# Patient Record
Sex: Male | Born: 1970 | Race: White | Hispanic: No | Marital: Single | Smoking: Current some day smoker
Health system: Southern US, Community
[De-identification: ages and names within clinical notes are randomized; demographics above are authoritative.]

## PROBLEM LIST (undated history)

## (undated) DIAGNOSIS — G40909 Epilepsy, unspecified, not intractable, without status epilepticus: Secondary | ICD-10-CM

## (undated) DIAGNOSIS — M549 Dorsalgia, unspecified: Secondary | ICD-10-CM

## (undated) HISTORY — PX: VAGUS NERVE STIMULATOR INSERTION: SHX348

---

## 2004-09-02 ENCOUNTER — Emergency Department: Payer: Self-pay | Admitting: Emergency Medicine

## 2005-01-28 ENCOUNTER — Emergency Department: Payer: Self-pay | Admitting: Emergency Medicine

## 2005-01-28 ENCOUNTER — Other Ambulatory Visit: Payer: Self-pay

## 2005-04-01 ENCOUNTER — Emergency Department: Payer: Self-pay | Admitting: Emergency Medicine

## 2010-01-29 ENCOUNTER — Encounter: Payer: Self-pay | Admitting: Rehabilitation

## 2010-02-09 ENCOUNTER — Encounter: Payer: Self-pay | Admitting: Rehabilitation

## 2010-03-12 ENCOUNTER — Encounter: Payer: Self-pay | Admitting: Rehabilitation

## 2016-07-02 ENCOUNTER — Encounter: Payer: Self-pay | Admitting: Emergency Medicine

## 2016-07-02 ENCOUNTER — Emergency Department
Admission: EM | Admit: 2016-07-02 | Discharge: 2016-07-02 | Discharge status: Against Medical Advice | Payer: Medicare Other | Attending: Emergency Medicine | Admitting: Emergency Medicine

## 2016-07-02 DIAGNOSIS — J111 Influenza due to unidentified influenza virus with other respiratory manifestations: Secondary | ICD-10-CM | POA: Insufficient documentation

## 2016-07-02 DIAGNOSIS — F17200 Nicotine dependence, unspecified, uncomplicated: Secondary | ICD-10-CM | POA: Insufficient documentation

## 2016-07-02 DIAGNOSIS — R69 Illness, unspecified: Secondary | ICD-10-CM

## 2016-07-02 DIAGNOSIS — R05 Cough: Secondary | ICD-10-CM | POA: Diagnosis present

## 2016-07-02 HISTORY — DX: Dorsalgia, unspecified: M54.9

## 2016-07-02 HISTORY — DX: Epilepsy, unspecified, not intractable, without status epilepticus: G40.909

## 2016-07-02 MED ORDER — SODIUM CHLORIDE 0.9 % IV BOLUS (SEPSIS)
1000.0000 mL | Freq: Once | INTRAVENOUS | Status: AC
  Administered 2016-07-02: 1000 mL via INTRAVENOUS

## 2016-07-02 MED ORDER — ONDANSETRON 4 MG PO TBDP: 4.0000 mg | ORAL_TABLET | Freq: Once | ORAL | Status: DC

## 2016-07-02 MED ORDER — OSELTAMIVIR PHOSPHATE 75 MG PO CAPS: 75.0000 mg | ORAL_CAPSULE | Freq: Once | ORAL | Status: DC

## 2016-07-02 MED ORDER — OSELTAMIVIR PHOSPHATE 75 MG PO CAPS: 75.0000 mg | ORAL_CAPSULE | Freq: Two times a day (BID) | ORAL | 0 refills | Status: DC

## 2016-07-02 MED ORDER — ONDANSETRON 4 MG PO TBDP: 4.0000 mg | ORAL_TABLET | Freq: Four times a day (QID) | ORAL | 0 refills | Status: DC | PRN

## 2016-07-02 NOTE — ED Triage Notes (Addendum)
Pt with flu like symptoms and unable to keep anything down. Pt states he had a seizure earlier today. Take meds for same but unable to keep anything down.  Pt currently take VIMPAT for seizures.

## 2016-07-02 NOTE — ED Provider Notes (Signed)
[facility] Emergency Department Provider Note   ____________________________________________   First MD Initiated Contact with Patient 07/02/16 1520     (approximate)  I have reviewed the triage vital signs and the nursing notes.   HISTORY  Chief Complaint Seizures; Generalized Body Aches; and Back Pain    HPI Algis DownsDavid S Lawniczak is a 45 y.o. male here for evaluation of "the flu". Patient reports his daughter and wife were both diagnosed as having the flu, and his wife was started on Tamiflu yesterday. This morning early in the morning he awoke with chills, runny nose, slight dry cough, nausea, and had a brief "seizure". He reports he has a long history of multiple seizures, and this was not unusual for him, but he does report he feels body aches and chills and as though he has developed "the flu". No shortness of breath or chest pain. No abdominal pain but nausea, and did vomit this morning.  States his full body is aching everywhere. No headache or neck stiffness.  Past Medical History:  Diagnosis Date  . Back pain   . Seizure disorder (HCC)     There are no active problems to display for this patient.   No past surgical history on file.  Prior to Admission medications   Medication Sig Start Date End Date Taking? Authorizing Provider  ondansetron (ZOFRAN ODT) 4 MG disintegrating tablet Take 1 tablet (4 mg total) by mouth every 6 (six) hours as needed for nausea or vomiting. 07/02/16   Sharyn CreamerMark Sadao Weyer, MD  oseltamivir (TAMIFLU) 75 MG capsule Take 1 capsule (75 mg total) by mouth 2 (two) times daily. 07/02/16   Sharyn CreamerMark Franky Reier, MD    Allergies Depakote [valproic acid] and Nsaids  No family history on file.  Social History Social History  Substance Use Topics  . Smoking status: Current Some Day Smoker  . Smokeless tobacco: Never Used  . Alcohol use Yes    Review of Systems Constitutional: Fever and chills Eyes: No visual changes. ENT: Scratchy  throat Cardiovascular: Denies chest pain. Respiratory: Denies shortness of breath. Gastrointestinal: No abdominal pain.    No diarrhea.  No constipation. Genitourinary: Negative for dysuria. Musculoskeletal: All his muscles feel "achy" all over. Skin: Negative for rash. Neurological: Negative for headaches, focal weakness or numbness.  10-point ROS otherwise negative.  ____________________________________________   PHYSICAL EXAM:  VITAL SIGNS: ED Triage Vitals [07/02/16 1316]  Enc Vitals Group     BP (!) 134/104     Pulse Rate (!) 110     Resp 20     Temp 99.5 F (37.5 C)     Temp Source Oral     SpO2 97 %     Weight      Height      Head Circumference      Peak Flow      Pain Score      Pain Loc      Pain Edu?      Excl. in GC?     Constitutional: Alert and oriented. Well appearing and in no acute distress.Appears mildly ill however. Eyes: Conjunctivae are slightly injected, no jaundice. PERRL. EOMI. Head: Atraumatic. Nose: Mild clear coryza and "sniffles" Mouth/Throat: Mucous membranes are slightly dry.  Oropharynx slightly injected. Neck: No stridor.  No meningismus or rigidity. Cardiovascular: Minimally tachycardic rate, regular rhythm. Grossly normal heart sounds.  Good peripheral circulation. Respiratory: Normal respiratory effort.  No retractions. Lungs CTAB. Gastrointestinal: Soft and nontender. No distention.  Musculoskeletal: Moves  all extremities well. Walks with a normal gait No lower extremity tenderness nor edema.  No joint effusions. Neurologic:  Normal speech and language. No gross focal neurologic deficits are appreciated. Normal cranial nerve exam. No tremors No gait instability. Skin:  Skin is warm, dry and intact. No rash noted. Psychiatric: Mood and affect are normal. Speech and behavior are normal. He is alert, oriented to date time and place.  ____________________________________________   LABS (all labs ordered are listed, but only abnormal  results are displayed)  Labs Reviewed - No data to display ____________________________________________  EKG  Reviewed and interpreted by me at 1320 Ventricular rate 100 QRS 90 QTc 410 Normal axis Mild ST elevation noted in V2 and V3, likely consistent with early repolarization abnormality. Compared with his previous EKG from 2006, appears similar   Patient denies any cardiac or pulmonary symptoms at this time ____________________________________________  RADIOLOGY   ____________________________________________   PROCEDURES  Procedure(s) performed: None  Procedures  Critical Care performed: No  ____________________________________________   INITIAL IMPRESSION / ASSESSMENT AND PLAN / ED COURSE  Pertinent labs & imaging results that were available during my care of the patient were reviewed by me and considered in my medical decision making (see chart for details).  Patient presents with a viral-type symptomatology, with upper respiratory symptoms and reporting his wife had diagnosis of the flu yesterday. Based on close contact with 2 persons who metered reports her diagnosed with the flu and one on Tamiflu, I suspect he likely also has influenza or influenza-like illness. He is notably slightly tachycardic with low-grade fever. No cardiac or pulmonary symptoms. Does report having a seizure, and was not able tolerate his seizure medicine at home this morning due to nausea.  I highly recommended to the patient, his mother, and also his daughter that he stay for hydration, further workup, labs, and further evaluation. He reports that he needs to leave. He appears to have capacity, clearly understands my recommendations and the risks of his leaving. I will provide him with discharge instructions, as well as prescription for Tamiflu and Zofran for symptomatic relief and treatment of possible flu discuss with him this very provisional diagnosis and I am not able to perform  additional laboratory testing at this time as the patient wishes to leave without further workup.   07/02/2016 at 3:32 PM:  The patient requested to leave.  I considered this to be leaving against medical advice. I personally discussed the following with them:  1)  That they currently had a medical condition of likely the flu or influenza and I am concerned that they may have dehydration and need further evaluation for the severity and possible need for admission.  I cannot exclude condition such as pneumonia, or of severe bacterial infection, or severe flu at this time  2)  My proposed course of evaluation and treatment includes, but is not limited to,  IV hydration, checking a flu test and labs, chest x-Pense, rehydration, providing him his seizure medicine.  Benefits of staying include possible diagnosis or excluding of severe influenza or other severe bacterial infection or an alternative serious condition such as pneumonia, which if identified early would lead to appropriate intervention in a timely manner lessening the burden of disability and death.  3) Risks of leaving before this had been completed include: misdiagnosis, worsening illness leading up to and including prolonged or permanent disability or death.  Specific risks pertinent, but not all inclusive, of their current medical condition include but are  not limited to worsening or severe infection.  I also discussed alternatives including staying in the ER for hydration and additional workup, or being admitted.  Despite this they stated they wanted to leave due to needing to go home and not wanting to stay in the ER or hospital and refused further evaluation, treatment, or admission at this time.   They appeared clinically sober, were mentating appropriately, were free from distracting injury, had adequately controlled acute pain, appeared to have intact insight, judgment, and reason, and in my opinion had the capacity to make this  decision.  Specifically, they were able to verbally state back in a coherent manner their current medical condition/current diagnosis, the proposed course of evaluation and/or treatment, and the risks, benefits, and alternatives of treatment versus leaving against medical advice.   They understand that they may return to seek medical attention here at ANY time they want.  I strongly advised them to return to the Emergency Department immediately if they experience any new or worsening symptoms that concern them, or simply if they reconsider continued evaluation and/or treatment as previously discussed.  This would be without any repercussions, though they understand they likely will need to wait again in the Emergency Department if other patients are in front of them, rather than being brought straight back.  They understood this is another advantage of staying, but still insisted upon leaving.  I recommended they follow-up with his primary care at [facility] at the earliest available opportunity/appointment for further evaluation and treatment.   The patient was discharged against medical advice, I recommended he not drive and he agrees with this.  They did accept written discharge instructions.    Clinical Course      ____________________________________________   FINAL CLINICAL IMPRESSION(S) / ED DIAGNOSES  Final diagnoses:  Influenza-like illness  Influenza      NEW MEDICATIONS STARTED DURING THIS VISIT:  New Prescriptions   ONDANSETRON (ZOFRAN ODT) 4 MG DISINTEGRATING TABLET    Take 1 tablet (4 mg total) by mouth every 6 (six) hours as needed for nausea or vomiting.   OSELTAMIVIR (TAMIFLU) 75 MG CAPSULE    Take 1 capsule (75 mg total) by mouth 2 (two) times daily.     Note:  This document was prepared using Dragon voice recognition software and may include unintentional dictation errors.     Sharyn Creamer, MD 07/02/16 [phone]

## 2016-07-02 NOTE — ED Notes (Signed)
Pt daughter has come to desk multiple times about pt getting upset about waiting. Daughter now states he is angry wants to leave. Daughter informed that we can't keep him here, Dillon AddisonKatie Rn asked first nurse to come talk to pt and daughter.

## 2016-07-02 NOTE — Discharge Instructions (Signed)
You have been seen in the Emergency Department (ED) today for a likely viral illness.  Please drink plenty of clear fluids (water, Gatorade, chicken broth, etc).  You may use Tylenol according to label instructions.  You can alternate between the two without any side effects.   No driving today, and please resume taking your regular seizure medications as prescribed.  Please follow up with your doctor as listed above.  Call your doctor or return to the Emergency Department (ED) if you are unable to tolerate fluids due to vomiting, have worsening trouble breathing, become extremely tired or difficult to awaken, or if you develop any other symptoms that concern you.

## 2016-07-02 NOTE — ED Notes (Signed)
Pt states "i just want to go home, I have ibuprofen there".  He reports does not want to do the labs just drawn or wait for fluids to finish. Dr Fanny Bienquale notified.

## 2018-11-29 ENCOUNTER — Emergency Department
Admission: EM | Admit: 2018-11-29 | Discharge: 2018-11-29 | Disposition: A | Discharge status: Home / Self Care | Payer: Medicare Other | Attending: Emergency Medicine | Admitting: Emergency Medicine

## 2018-11-29 ENCOUNTER — Emergency Department: Payer: Medicare Other

## 2018-11-29 ENCOUNTER — Encounter: Payer: Self-pay | Admitting: Emergency Medicine

## 2018-11-29 DIAGNOSIS — R569 Unspecified convulsions: Secondary | ICD-10-CM | POA: Diagnosis present

## 2018-11-29 DIAGNOSIS — Z79899 Other long term (current) drug therapy: Secondary | ICD-10-CM | POA: Diagnosis not present

## 2018-11-29 DIAGNOSIS — F17200 Nicotine dependence, unspecified, uncomplicated: Secondary | ICD-10-CM | POA: Diagnosis not present

## 2018-11-29 DIAGNOSIS — Z91040 Latex allergy status: Secondary | ICD-10-CM | POA: Insufficient documentation

## 2018-11-29 LAB — CBC
HCT: 45.5 % (ref 39.0–52.0)
Hemoglobin: 15.3 g/dL (ref 13.0–17.0)
MCH: 30 pg (ref 26.0–34.0)
MCHC: 33.6 g/dL (ref 30.0–36.0)
MCV: 89.2 fL (ref 80.0–100.0)
Platelets: 215 10*3/uL (ref 150–400)
RBC: 5.1 MIL/uL (ref 4.22–5.81)
RDW: 14 % (ref 11.5–15.5)
WBC: 8 10*3/uL (ref 4.0–10.5)
nRBC: 0 % (ref 0.0–0.2)

## 2018-11-29 LAB — BASIC METABOLIC PANEL
Anion gap: 12 (ref 5–15)
BUN: 17 mg/dL (ref 6–20)
CO2: 19 mmol/L — ABNORMAL LOW (ref 22–32)
Calcium: 8.8 mg/dL — ABNORMAL LOW (ref 8.9–10.3)
Chloride: 103 mmol/L (ref 98–111)
Creatinine, Ser: 1.44 mg/dL — ABNORMAL HIGH (ref 0.61–1.24)
GFR calc Af Amer: >60 mL/min (ref >60)
GFR calc non Af Amer: 58 mL/min — ABNORMAL LOW (ref >60)
Glucose, Bld: 163 mg/dL — ABNORMAL HIGH (ref 70–99)
Potassium: 3.7 mmol/L (ref 3.5–5.1)
Sodium: 134 mmol/L — ABNORMAL LOW (ref 135–145)

## 2018-11-29 MED ORDER — SODIUM CHLORIDE 0.9 % IV BOLUS
500.0000 mL | Freq: Once | INTRAVENOUS | Status: AC
Start: 2018-11-29 — End: 2018-11-29
  Administered 2018-11-29: 500 mL via INTRAVENOUS

## 2018-11-29 MED ORDER — LORAZEPAM 2 MG/ML IJ SOLN
0.5000 mg | Freq: Once | INTRAMUSCULAR | Status: AC
  Administered 2018-11-29: 0.5 mg via INTRAVENOUS
  Filled 2018-11-29: qty 1

## 2018-11-29 MED ORDER — SODIUM CHLORIDE 0.9 % IV SOLN
200.0000 mg | INTRAVENOUS | Status: AC
  Administered 2018-11-29: 200 mg via INTRAVENOUS
  Filled 2018-11-29: qty 20

## 2018-11-29 NOTE — ED Triage Notes (Signed)
Pt to ED by EMS after having a seizure this morning. Pt has hx of seizures and MS. Pt hit head on bathroom mirror this morning and has multiple cuts on back possibly from glass.

## 2018-11-29 NOTE — ED Notes (Addendum)
Pt refusing to keep monitor leads on. Pt A&O x4 and NAD at this time. Pt asking when he can be "released" to go home.

## 2018-11-29 NOTE — ED Notes (Signed)
Pt discharged home after verbalizing understanding of discharge instructions; nad noted. 

## 2018-11-29 NOTE — Discharge Instructions (Addendum)
Please follow-up closely with your neurologist at [facility].  They should be calling you, but please call them Monday to speak with your physician and team about your seizure this weekend.  No driving cars, swimming in bodies of water, or going up to high heights or places where you could become injured if you had a seizure.  You have been seen in the emergency department today for a seizure.  Your workup today including labs are within normal limits.  Please follow up with your doctor/neurologist as soon as possible regarding today's emergency department visit and your likely seizure.  Please continue your current medications  As we have discussed it is very important that you do not drive until you have been seen and cleared by your neurologist.  Please drink plenty of fluids, get plenty of sleep and avoid any alcohol or drug use Please return to the emergency department if you have any further seizures which do not respond to medications, or for any other symptoms per se concerning for yourself.

## 2018-11-29 NOTE — ED Provider Notes (Signed)
[facility] Emergency Department Provider Note   ____________________________________________   First MD Initiated Contact with Patient 11/29/18 [phone]     (approximate)  I have reviewed the triage vital signs and the nursing notes.   HISTORY  Chief Complaint Seizures    HPI Dillon Ryan is a 48 y.o. male history of refractory seizures with vagal nerve stimulator  Patient presents today, EMS was called out for a witnessed seizure.  Patient had a seizure, description not quite fully known, but had fallen forward and broke the mirror in his bathroom.  He has a contusion to the front of the head.  He was postictal and somewhat agitated, but at the present time he is calm and pleasant.  He reports that he does not remember what happened, but he is pretty sure he had a seizure.  Reports the same thing is happened to him many times in the past.  He has a vagal nerve stimulator, EMS did report that he had not had his medications yet today.  Patient believes he has been compliant with his medication and his only seizure medicine is Vimpat  He has not had any nausea or vomiting.  No fevers or chills.  No chest pain or trouble breathing.   Past Medical History:  Diagnosis Date  . Back pain   . Seizure disorder (HCC)     There are no active problems to display for this patient.   History reviewed. No pertinent surgical history.  Prior to Admission medications   Medication Sig Start Date End Date Taking? Authorizing Provider  clonazePAM (KLONOPIN) 1 MG tablet Take 1 mg by mouth 2 (two) times daily. 06/23/18 10/12/19 Yes [provider]  lacosamide (VIMPAT) 200 MG TABS tablet Take 600 mg by mouth 2 (two) times daily. 06/23/18 06/23/19 Yes [provider]  oxyCODONE-acetaminophen (PERCOCET) 10-325 MG tablet Take 1 tablet by mouth 3 (three) times daily. 11/11/18  Yes [provider]  sildenafil (REVATIO) 20 MG tablet Take 20 mg by mouth as  directed. 08/13/18  Yes [provider]  simvastatin (ZOCOR) 40 MG tablet Take 40 mg by mouth Nightly. 11/09/18  Yes [provider]  topiramate (TOPAMAX) 50 MG tablet Take 50-100 mg by mouth 2 (two) times daily. 10/12/18  Yes [provider]  ondansetron (ZOFRAN ODT) 4 MG disintegrating tablet Take 1 tablet (4 mg total) by mouth every 6 (six) hours as needed for nausea or vomiting. Patient not taking: Reported on 11/29/2018 07/02/16   Sharyn Creamer, MD  oseltamivir (TAMIFLU) 75 MG capsule Take 1 capsule (75 mg total) by mouth 2 (two) times daily. Patient not taking: Reported on 11/29/2018 07/02/16   Sharyn Creamer, MD    Allergies Other; Phenytoin; Valproic acid; Latex; Perampanel; and Nsaids  History reviewed. No pertinent family history.  Social History Social History   Tobacco Use  . Smoking status: Current Some Day Smoker  . Smokeless tobacco: Never Used  Substance Use Topics  . Alcohol use: Yes  . Drug use: Not on file    Review of Systems  Somewhat unreliable and the patient has poor recollection of today's events, but denies acute illness and feels that he definitely had a "seizure" as he had the same in the past many times  Constitutional: No fever/chills Eyes: No visual changes. ENT: No sore throat. Cardiovascular: Denies chest pain. Respiratory: Denies shortness of breath. Gastrointestinal: No abdominal pain.   Genitourinary: Negative for dysuria. Musculoskeletal: Negative for back pain. Skin: Negative  for rash except he feels like he is got some cuts or something over his right upper back. Neurological: Negative for areas of focal weakness or numbness.  He does report he is having a moderate throbbing headache and some tenderness across the front of the skull.  He gets headaches after seizures.    ____________________________________________   PHYSICAL EXAM:  VITAL SIGNS: ED Triage Vitals  Enc Vitals Group     BP 11/29/18 0849 93/63      Pulse Rate 11/29/18 0849 72     Resp 11/29/18 0849 (!) 24     Temp 11/29/18 0849 98.2 F (36.8 C)     Temp Source 11/29/18 0849 Oral     SpO2 11/29/18 0849 98 %     Weight 11/29/18 0850 170 lb (77.1 kg)     Height 11/29/18 0850 6' (1.829 m)     Head Circumference --      Peak Flow --      Pain Score 11/29/18 0849 10     Pain Loc --      Pain Edu? --      Excl. in GC? --     Constitutional: Alert and oriented to self and place, was evidently quite postictal with EMS but he is now seeming to clear but does not recall seizure occurring. Well appearing and in no acute distress. Eyes: Conjunctivae are normal.  Normal extraocular movements. Head: Atraumatic for a very small contusion and abrasion over the left frontal region. Nose: No congestion/rhinnorhea. Mouth/Throat: Mucous membranes are moist. Neck: No stridor.  Cardiovascular: Normal rate, regular rhythm. Grossly normal heart sounds.  Good peripheral circulation. Respiratory: Normal respiratory effort.  No retractions. Lungs CTAB. Gastrointestinal: Soft and nontender. No distention. Musculoskeletal: No lower extremity tenderness nor edema. Neurologic:  Normal speech and language. No gross focal neurologic deficits are appreciated.  He is awake and alert.  No tremulousness.  Moves all extremities well.  Follows commands. Skin:  Skin is warm, dry and intact. No rash noted except for about 4-5 superficial abrasions over the right posterior chest wall.Marland Kitchen. Psychiatric: Mood and affect are normal. Speech and behavior are normal.  ____________________________________________   LABS (all labs ordered are listed, but only abnormal results are displayed)  Labs Reviewed  BASIC METABOLIC PANEL - Abnormal; Notable for the following components:      Result Value   Sodium 134 (*)    CO2 19 (*)    Glucose, Bld 163 (*)    Creatinine, Ser 1.44 (*)    Calcium 8.8 (*)    GFR calc non Af Amer 58 (*)    All other components within normal limits   CBC   ____________________________________________  EKG  Reviewed and interpreted by me at 9 AM Heart rate 75 QRS 110 QTc 400 Normal sinus rhythm, there is slight Q wave in V1 and V2, with slight ST elevation notable in V2.  Also some very minimal T wave abnormality seen in V5 and V6, compared with this previous EKG from 2017 this is unchanged.  Of note the patient has no cardiopulmonary complaint either. ____________________________________________  RADIOLOGY  Ct Head Wo Contrast  Result Date: 11/29/2018 CLINICAL DATA:  Seizure.  Multiple lacerations posteriorly. EXAM: CT HEAD WITHOUT CONTRAST TECHNIQUE: Contiguous axial images were obtained from the base of the skull through the vertex without intravenous contrast. COMPARISON:  January 28, 2005 FINDINGS: Brain: No evidence of acute infarction, hemorrhage, hydrocephalus, extra-axial collection or mass lesion/mass effect. Vascular: No hyperdense vessel or unexpected  calcification. Skull: Normal. Negative for fracture or focal lesion. Sinuses/Orbits: No acute finding. Other: None. IMPRESSION: No acute intracranial abnormalities identified. Electronically Signed   By: Gerome Sam III M.D   On: 11/29/2018 09:34    CT head reviewed negative for acute ____________________________________________   PROCEDURES  Procedure(s) performed: None  Procedures  Critical Care performed: No  ____________________________________________   INITIAL IMPRESSION / ASSESSMENT AND PLAN / ED COURSE  Pertinent labs & imaging results that were available during my care of the patient were reviewed by me and considered in my medical decision making (see chart for details).   Patient presents for evaluation for what appears to have been a breakthrough seizure.  Patient has a well known and well-documented history of seizure disorder, he is currently treated on Vimpat.  Today he had a seizure while in his bathroom.  He does have an abrasion over the forehead  and small superficial lacerations without evidence of acute foreign body over the right thoracic region without any puncture wounds.  Ordered a head CT, basic labs, based on his [facility] notes I have ordered a 200 mg Vimpat dose but will clarify as there is some potential he may need a higher dose today.  His mental status is clearly improving, and he is not complaining of any cardiac pulmonary or vascular concerns.  He has a reassuring neurologic exam without any focal deficits.  Clinical Course as of Nov 28 1109  Sun Nov 29, 2018  0945 Patient is doing well, is resting comfortably no distress at this time.  Head CT reviewed negative for acute.  Still awaiting callback from [facility] neurology   [MQ]  [phone] Patient is resting comfortably now.  He states he feels like he would be about ready to go home.  He did take his Vimpat 400 mg last night but had not had his dose this morning.  Reports he feels that he is in good health now, no complaints and his headache has improved.  He appears well, fully alert and in no distress.  Still awaiting callback from his neurology team   [MQ]  1034 Discussed case and care discussed with Dr. Dewaine Conger (neurology at [facility]).    [MQ]  1037 Dr. Dewaine Conger advises he has issues of break through seizures. Dr. Dewaine Conger will advise his epilepsy doctor who will contact patient regarding any medication changes. Patient has not had a seizure for about 2 months. Dr. Dewaine Conger feels appropriate for outpatient follow-up with Dr. Donette Larry. Continue current Vimpat dosing.    [MQ]    Clinical Course User Index [MQ] Sharyn Creamer, MD   ----------------------------------------- 9:01 AM on 11/29/2018 -----------------------------------------  Await return call for phone consultation which I have placed with [facility] neurology  ----------------------------------------- 11:11 AM on 11/29/2018 -----------------------------------------  Patient resting comfortably.  Feels well and would like to go home.  He  is well-appearing nontoxic with normal vital signs and fully alert.  He appears well.  He is not driving himself home, having someone pick him up.  Return precautions and treatment recommendations and follow-up discussed with the patient who is agreeable with the plan.  ____________________________________________   FINAL CLINICAL IMPRESSION(S) / ED DIAGNOSES  Final diagnoses:  Seizure (HCC)        Note:  This document was prepared using Dragon voice recognition software and may include unintentional dictation errors       Sharyn Creamer, MD 11/29/18 1112

## 2019-12-20 ENCOUNTER — Emergency Department: Payer: Medicare Other

## 2019-12-20 ENCOUNTER — Other Ambulatory Visit: Payer: Self-pay

## 2019-12-20 ENCOUNTER — Emergency Department
Admission: EM | Admit: 2019-12-20 | Discharge: 2019-12-20 | Disposition: A | Discharge status: Home / Self Care | Payer: Medicare Other | Attending: Emergency Medicine | Admitting: Emergency Medicine

## 2019-12-20 DIAGNOSIS — W228XXA Striking against or struck by other objects, initial encounter: Secondary | ICD-10-CM | POA: Insufficient documentation

## 2019-12-20 DIAGNOSIS — Z91040 Latex allergy status: Secondary | ICD-10-CM | POA: Insufficient documentation

## 2019-12-20 DIAGNOSIS — Y999 Unspecified external cause status: Secondary | ICD-10-CM | POA: Insufficient documentation

## 2019-12-20 DIAGNOSIS — S0992XA Unspecified injury of nose, initial encounter: Secondary | ICD-10-CM | POA: Diagnosis present

## 2019-12-20 DIAGNOSIS — F17200 Nicotine dependence, unspecified, uncomplicated: Secondary | ICD-10-CM | POA: Diagnosis not present

## 2019-12-20 DIAGNOSIS — S0031XA Abrasion of nose, initial encounter: Secondary | ICD-10-CM | POA: Insufficient documentation

## 2019-12-20 DIAGNOSIS — Y93H2 Activity, gardening and landscaping: Secondary | ICD-10-CM | POA: Diagnosis not present

## 2019-12-20 DIAGNOSIS — R569 Unspecified convulsions: Secondary | ICD-10-CM | POA: Insufficient documentation

## 2019-12-20 DIAGNOSIS — Y92007 Garden or yard of unspecified non-institutional (private) residence as the place of occurrence of the external cause: Secondary | ICD-10-CM | POA: Insufficient documentation

## 2019-12-20 NOTE — ED Notes (Signed)
See triage note  Presents with possible f/b up his nose  States he felt some in left narre

## 2019-12-20 NOTE — Discharge Instructions (Signed)
Apply Vaseline or antibiotic ointment inside to 3 times a day.  Follow-up with your primary care provider for symptoms that are not improving over the next few days.  Return to the emergency department for symptoms change or worsen if you are unable to schedule appointment.

## 2019-12-20 NOTE — ED Provider Notes (Signed)
[facility] Emergency Department Provider Note ____________________________________________   First MD Initiated Contact with Patient 12/20/19 1203     (approximate)  I have reviewed the triage vital signs and the nursing notes.   HISTORY  Chief Complaint Foreign Body in Nose  HPI Dillon Ryan is a 49 y.o. male presents to the emergency department for treatment and evaluation of facial pain and sensation of retained foreign body in his nose.  He states that he was mowing yesterday and something flew inside his nose.  He states that he used a Nettie pot rinse his sinuses and "chunks of wood" came out.  He wants to make sure that nothing else is in there.         Past Medical History:  Diagnosis Date  . Back pain   . Seizure disorder (HCC)     There are no problems to display for this patient.   Past Surgical History:  Procedure Laterality Date  . VAGUS NERVE STIMULATOR INSERTION      Prior to Admission medications   Medication Sig Start Date End Date Taking? Authorizing Provider  clonazePAM (KLONOPIN) 1 MG tablet Take 1 mg by mouth 2 (two) times daily. 06/23/18 10/12/19  [provider]  lacosamide (VIMPAT) 200 MG TABS tablet Take 600 mg by mouth 2 (two) times daily. 06/23/18 06/23/19  [provider]  oxyCODONE-acetaminophen (PERCOCET) 10-325 MG tablet Take 1 tablet by mouth 3 (three) times daily. 11/11/18   [provider]  sildenafil (REVATIO) 20 MG tablet Take 20 mg by mouth as directed. 08/13/18   [provider]  simvastatin (ZOCOR) 40 MG tablet Take 40 mg by mouth Nightly. 11/09/18   [provider]  topiramate (TOPAMAX) 50 MG tablet Take 50-100 mg by mouth 2 (two) times daily. 10/12/18   [provider]    Allergies Other, Phenytoin, Valproic acid, Latex, Perampanel, and Nsaids  No family history on file.  Social History Social History   Tobacco Use  . Smoking status: Current Some Day  Smoker  . Smokeless tobacco: Never Used  Substance Use Topics  . Alcohol use: Yes  . Drug use: Yes    Types: Marijuana    Review of Systems  Constitutional: No fever/chills Eyes: No visual changes. ENT: No sore throat.  Positive for foreign body sensation in the left nostril Cardiovascular: Denies chest pain. Respiratory: Denies shortness of breath. Gastrointestinal: No abdominal pain.  No nausea, no vomiting.  No diarrhea.  No constipation. Genitourinary: Negative for dysuria. Musculoskeletal: Negative for back pain. Skin: Negative for rash. Neurological: Negative for headaches, focal weakness or numbness. ____________________________________________   PHYSICAL EXAM:  VITAL SIGNS: ED Triage Vitals  Enc Vitals Group     BP 12/20/19 1127 131/81     Pulse Rate 12/20/19 1127 60     Resp 12/20/19 1127 18     Temp 12/20/19 1127 98.2 F (36.8 C)     Temp Source 12/20/19 1127 Oral     SpO2 12/20/19 1127 99 %     Weight 12/20/19 1127 165 lb (74.8 kg)     Height 12/20/19 1127 6' (1.829 m)     Head Circumference --      Peak Flow --      Pain Score 12/20/19 1131 8     Pain Loc --      Pain Edu? --      Excl. in GC? --     Constitutional: Alert and oriented. Well appearing and  in no acute distress. Eyes: Conjunctivae are normal. PERRL. EOMI. Head: Atraumatic. Nose: No congestion/rhinnorhea.  No epistaxis.  Abrasion noted on the septal side of the mucosa of the left nostril.  No retained foreign body visible. Mouth/Throat: Mucous membranes are moist.  Oropharynx non-erythematous. Neck: No stridor.   Hematological/Lymphatic/Immunilogical: No cervical lymphadenopathy. Cardiovascular: Normal rate, regular rhythm. Grossly normal heart sounds.  Good peripheral circulation. Respiratory: Normal respiratory effort.  No retractions. Lungs CTAB. Gastrointestinal: Soft and nontender. No distention. No abdominal bruits. No CVA tenderness. Genitourinary:  Musculoskeletal: No lower  extremity tenderness nor edema.  No joint effusions. Neurologic:  Normal speech and language. No gross focal neurologic deficits are appreciated. No gait instability. Skin:  Skin is warm, dry and intact. No rash noted. Psychiatric: Mood and affect are normal. Speech and behavior are normal.  ____________________________________________   LABS (all labs ordered are listed, but only abnormal results are displayed)  Labs Reviewed - No data to display ____________________________________________  EKG  Not indicated ____________________________________________  RADIOLOGY  ED MD interpretation:    Image of the nasal bones shows no acute fracture or other bony abnormality.  No radiopaque foreign body.  I, Sherrie George, personally viewed and evaluated these images (plain radiographs) as part of my medical decision making, as well as reviewing the written report by the radiologist.  Official radiology report(s): DG Nasal Bones  Result Date: 12/20/2019 CLINICAL DATA:  Pt states he was mowing yesterday and the wind blew back and something went up his nose, states he blew it and pieces of wood came out,. Pt is c/o pain to the left nare. EXAM: NASAL BONES - 3+ VIEW COMPARISON:  None. FINDINGS: There is no evidence of fracture or other bone abnormality. IMPRESSION: Negative. Electronically Signed   By: Kathreen Devoid   On: 12/20/2019 12:56    ____________________________________________   PROCEDURES  Procedure(s) performed (including Critical Care):  Procedures  ____________________________________________   INITIAL IMPRESSION / ASSESSMENT AND PLAN     49 year old male presenting to the emergency department for treatment and evaluation of foreign body sensation in his left nostril.  See HPI for further details.  DIFFERENTIAL DIAGNOSIS  Abrasion, retained foreign body, nasal bone fracture.  ED COURSE  Nasal bone images are reassuring.  Abrasion to the septal aspect of the  mucosa of left nostril is noted.  Patient was advised to use Neosporin or Vaseline couple times a day.  He was encouraged to see primary care or return to the emergency department for symptoms of concern. ____________________________________________   FINAL CLINICAL IMPRESSION(S) / ED DIAGNOSES  Final diagnoses:  Abrasion, nose without infection     ED Discharge Orders    None       CALLIE BUNYARD was evaluated in Emergency Department on 12/20/2019 for the symptoms described in the history of present illness. He was evaluated in the context of the global COVID-19 pandemic, which necessitated consideration that the patient might be at risk for infection with the SARS-CoV-2 virus that causes COVID-19. Institutional protocols and algorithms that pertain to the evaluation of patients at risk for COVID-19 are in a state of rapid change based on information released by regulatory bodies including the CDC and federal and state organizations. These policies and algorithms were followed during the patient's care in the ED.   Note:  This document was prepared using Dragon voice recognition software and may include unintentional dictation errors.   Victorino Dike, FNP 12/20/19 1636    Arta Silence, MD 12/20/19  2013  

## 2019-12-20 NOTE — ED Triage Notes (Signed)
Pt states he was mowing yesterday and the wind blew back and something went up his nose, states he blew it and pieces of wood came out,. Pt is c/o pain to the left nare.

## 2020-12-25 IMAGING — CR DG NASAL BONES 3+V
1 series · 3 of 3 positions shown · non-contrast
Comparison: None.

CLINICAL DATA: Pt states he was mowing yesterday and the wind blew
back and something went up his nose, states he blew it and pieces of
Carlson came out,. Pt is c/o pain to the left nare.

EXAM:
NASAL BONES - 3+ VIEW

[Series 1: dg nasal bones · 0.14mm/px · 3 of 3 slices shown]
[im 1/3]
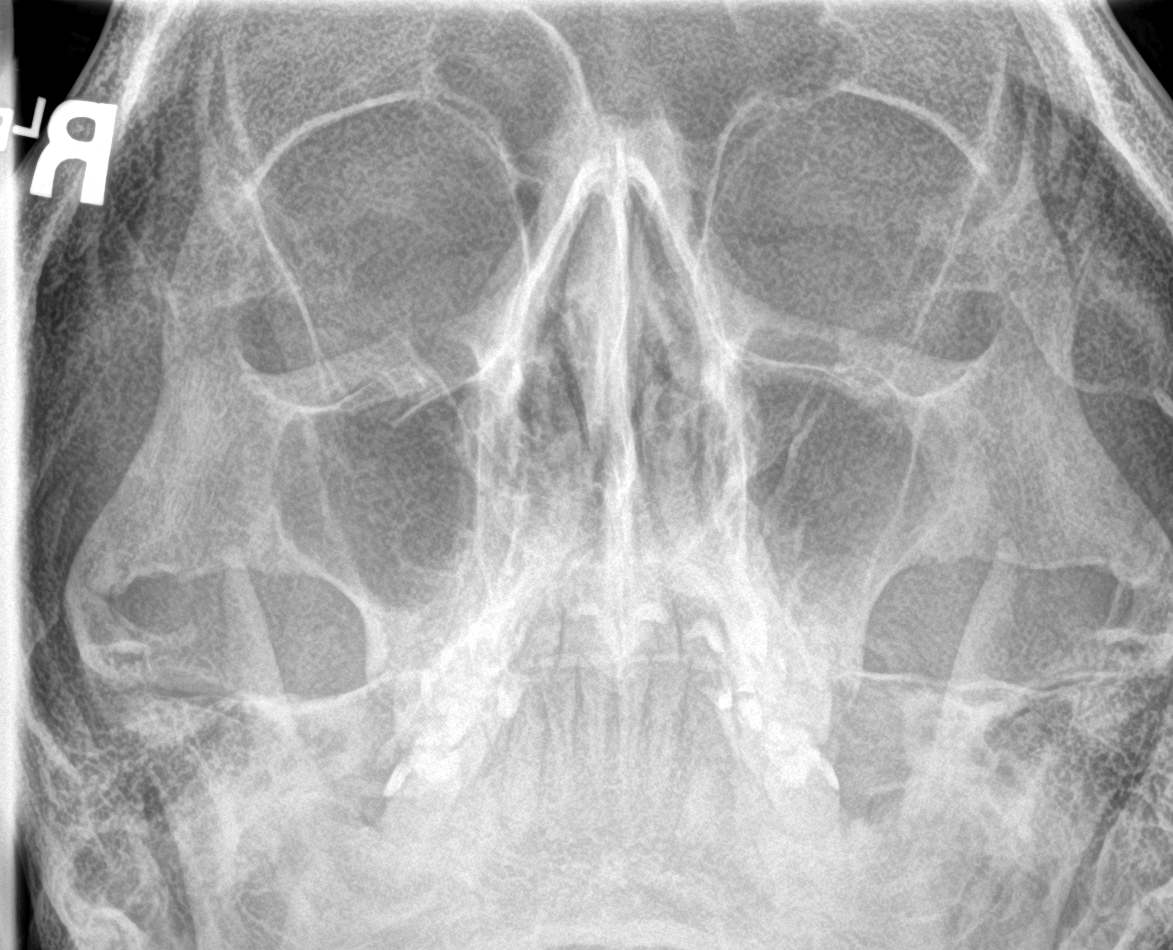
[im 2/3]
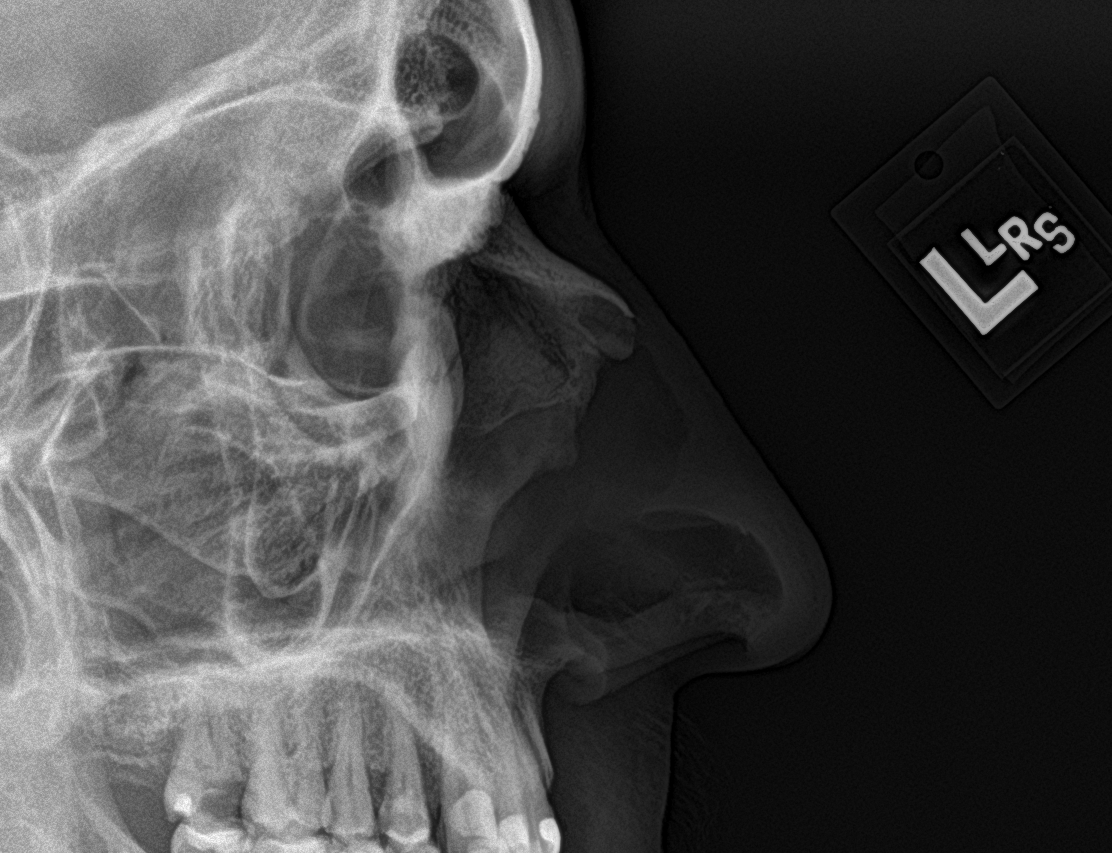
[im 3/3]
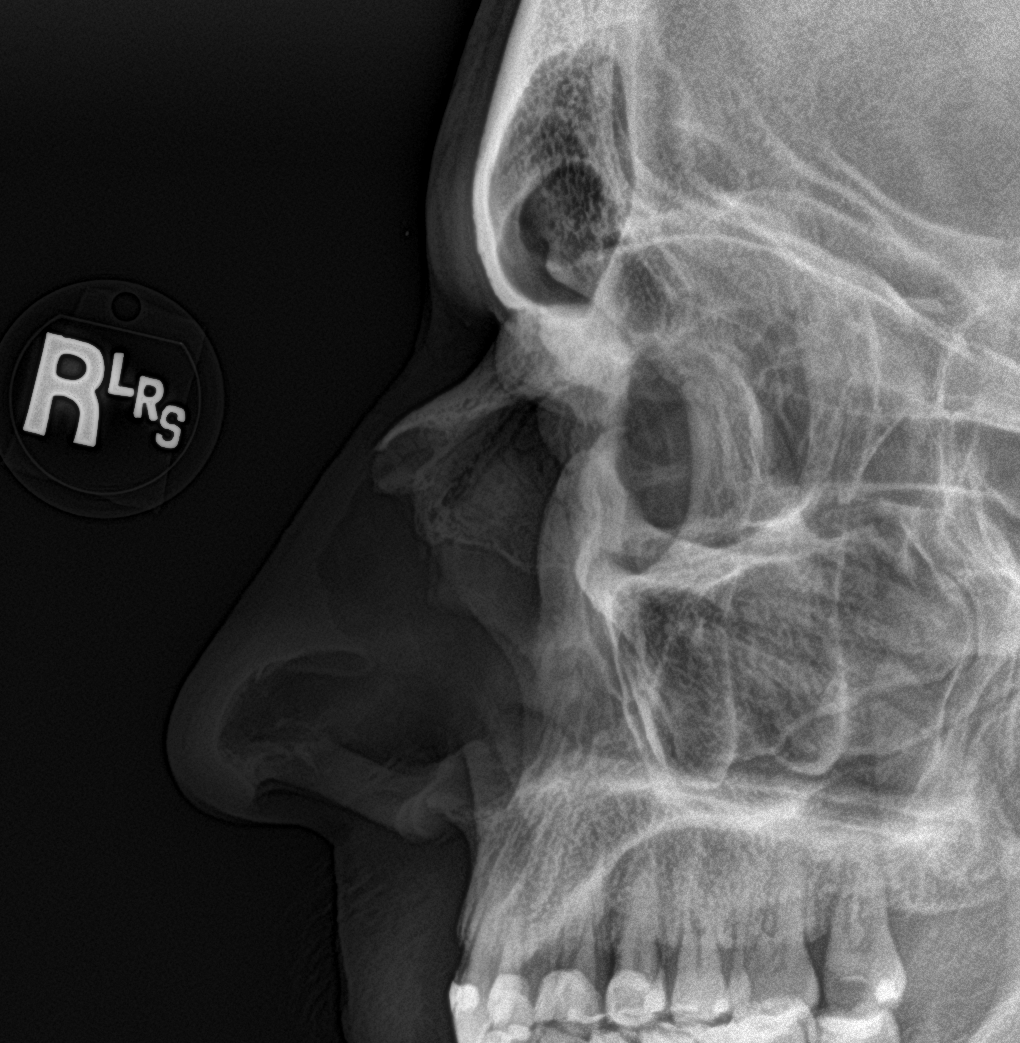

[3 of 3 positions shown; findings below may reference images not displayed]

FINDINGS: There is no evidence of fracture or other bone abnormality.
IMPRESSION: Negative.
